# Patient Record
Sex: Female | Born: 2000 | Race: Black or African American | Hispanic: No | Smoking: Never smoker
Health system: Southern US, Community
[De-identification: ages and names within clinical notes are randomized; demographics above are authoritative.]

## PROBLEM LIST (undated history)

## (undated) DIAGNOSIS — J45909 Unspecified asthma, uncomplicated: Secondary | ICD-10-CM

## (undated) DIAGNOSIS — D571 Sickle-cell disease without crisis: Secondary | ICD-10-CM

## (undated) HISTORY — DX: Sickle-cell disease without crisis: D57.1

## (undated) HISTORY — PX: DIGIT NAIL REMOVAL: SHX5052

---
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
## 2023-04-04 IMAGING — US US PELVIS COMPLETE WITH TRANSVAGINAL
1 series · 13 of 25 positions shown · non-contrast
Comparison: None

CLINICAL DATA: Abnormal uterine bleeding

EXAM:
TRANSABDOMINAL AND TRANSVAGINAL ULTRASOUND OF PELVIS
TECHNIQUE: Both transabdominal and transvaginal ultrasound examinations of the
pelvis were performed. Transabdominal technique was performed for
global imaging of the pelvis including uterus, ovaries, adnexal
regions, and pelvic cul-de-sac. It was necessary to proceed with
endovaginal exam following the transabdominal exam to visualize the
uterus endometrium adnexa.

[Series 1: us pelvis complete with transvaginal · 0.17mm/px · 13 of 112 slices shown]
[im 1/112]
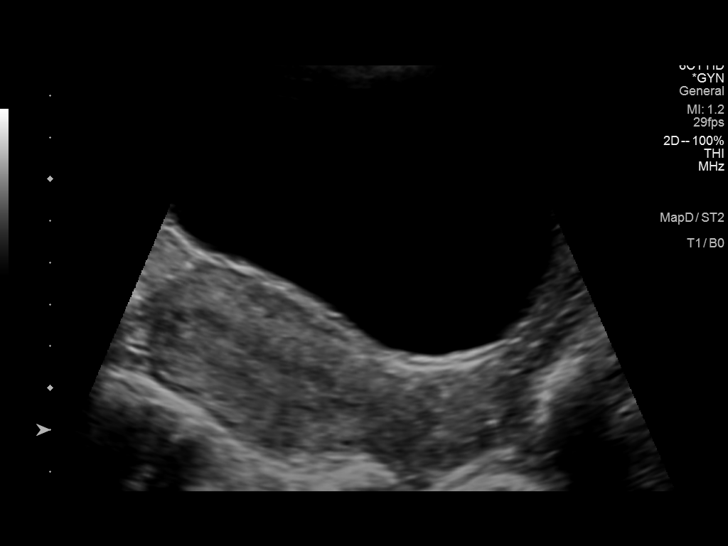
[im 10/112]
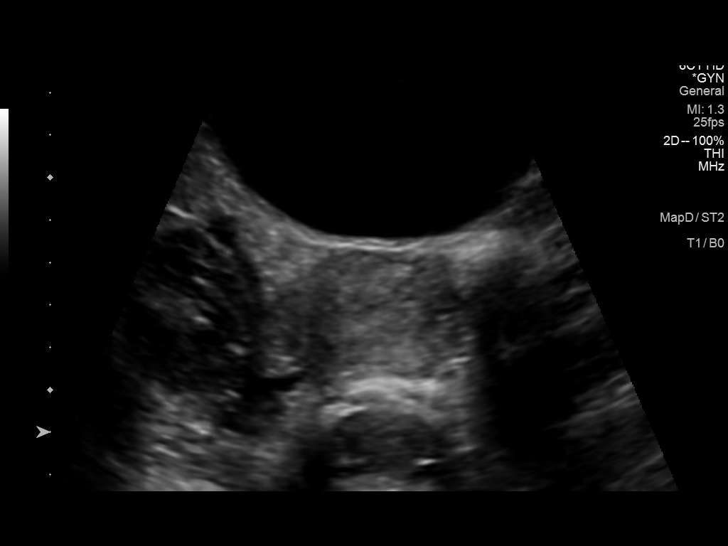
[im 19/112]
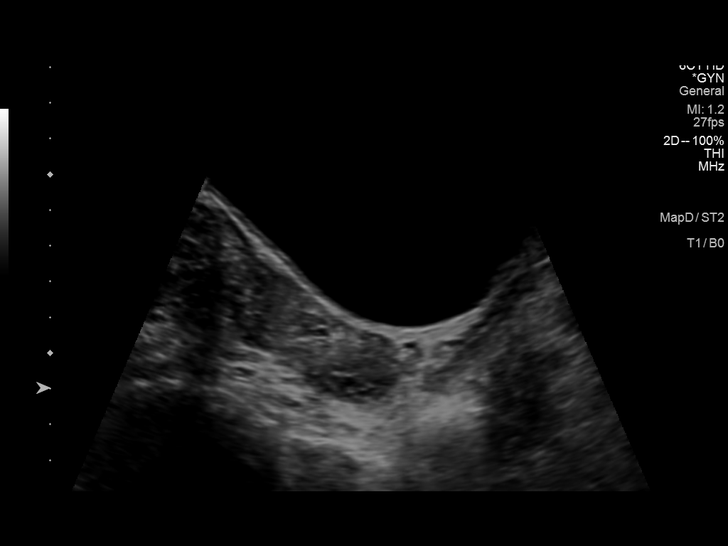
[im 28/112]
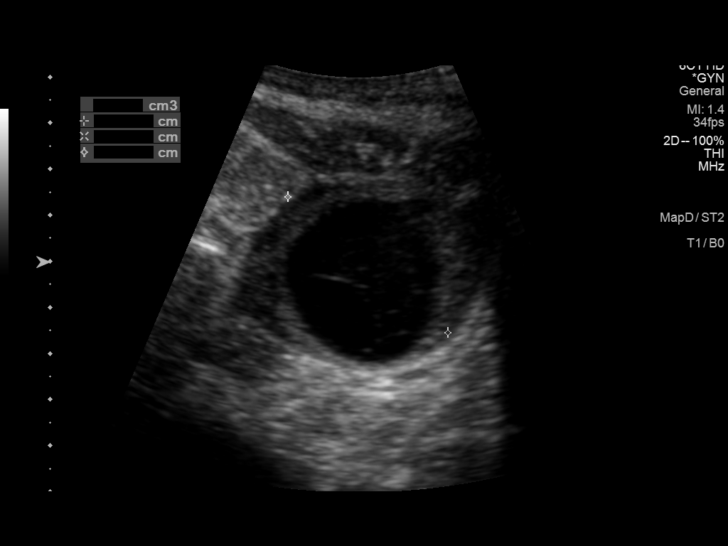
[im 38/112]
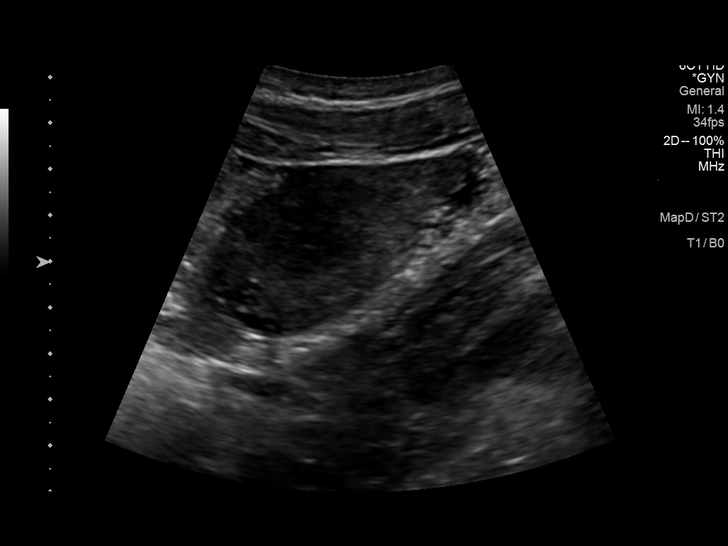
[im 47/112]
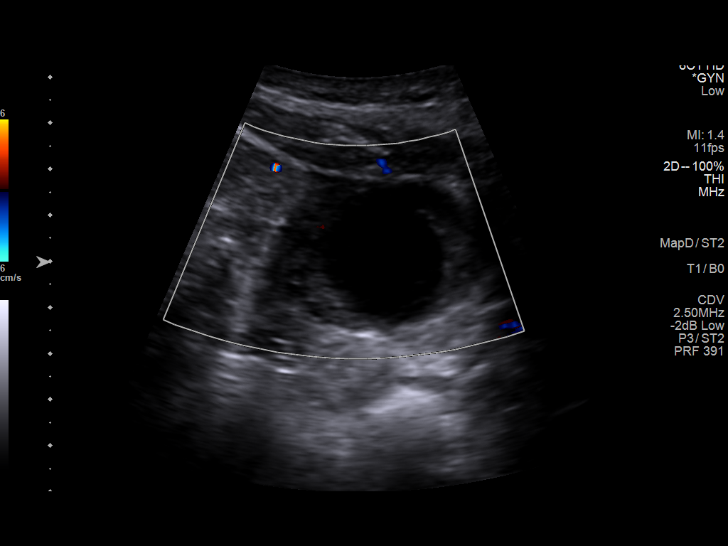
[im 56/112]
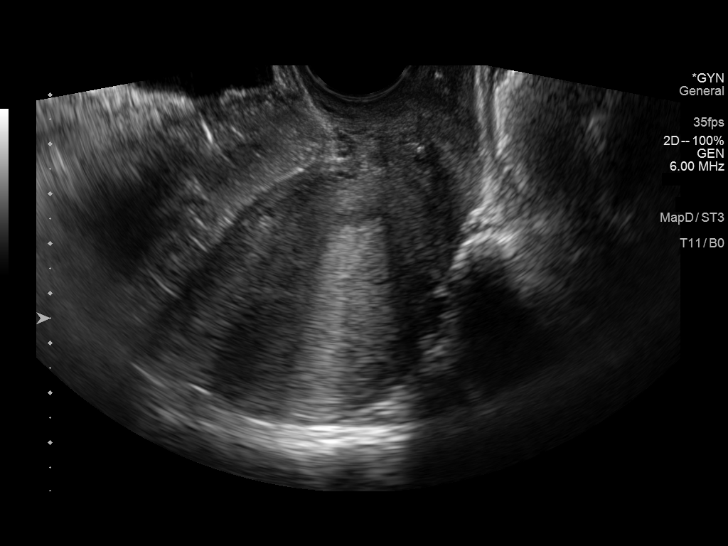
[im 65/112]
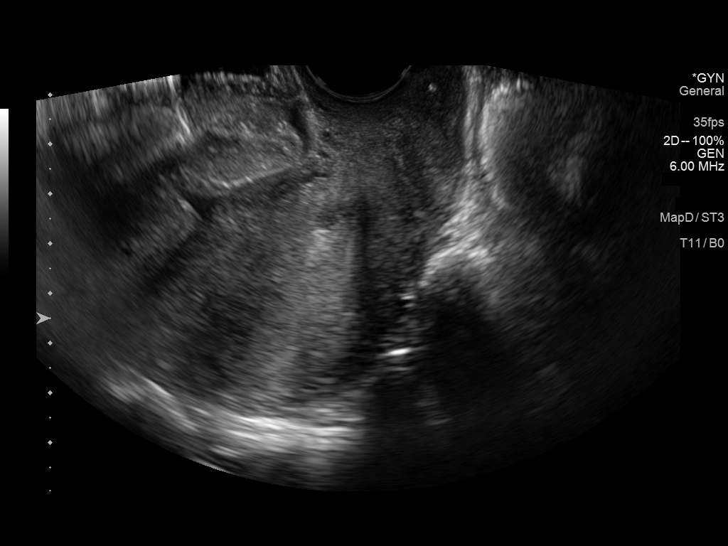
[im 75/112]
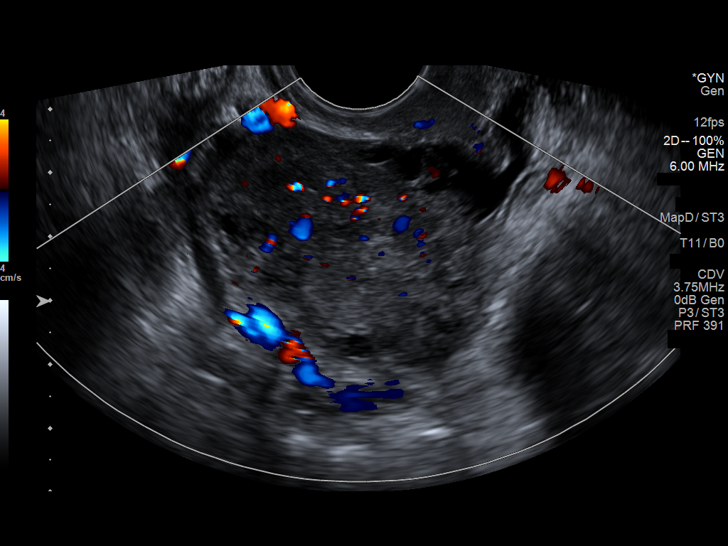
[im 84/112]
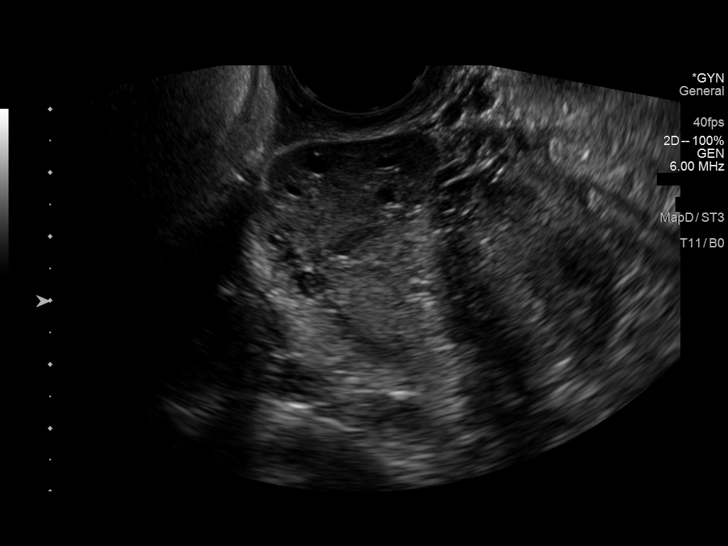
[im 93/112]
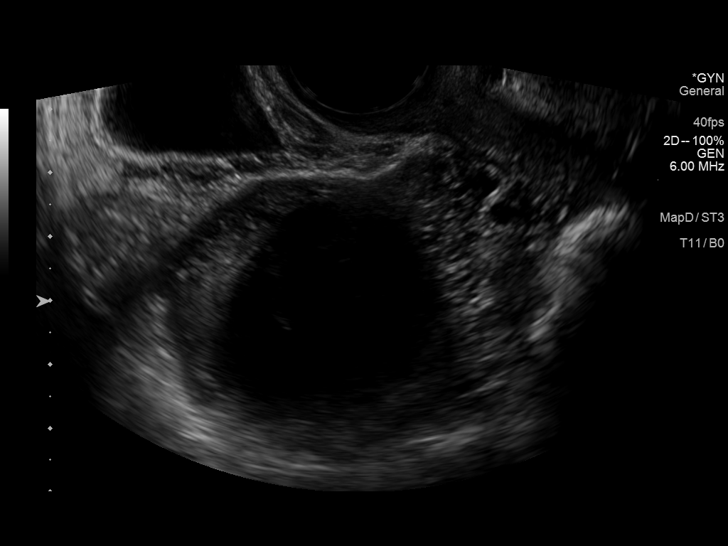
[im 102/112]
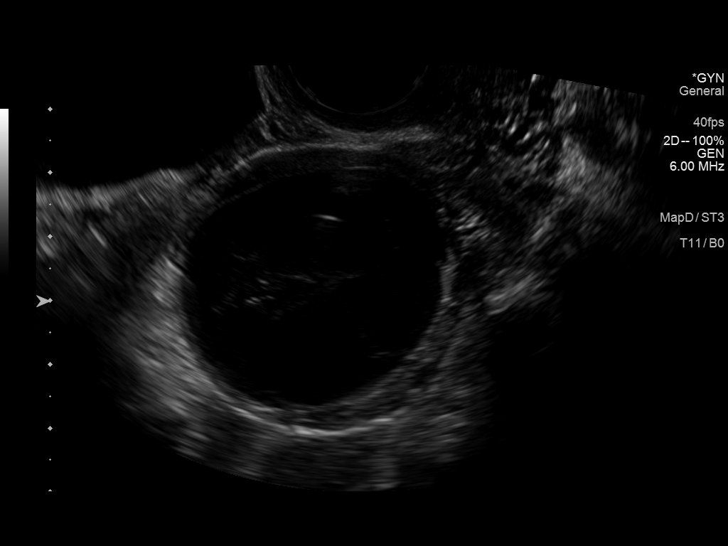
[im 112/112]
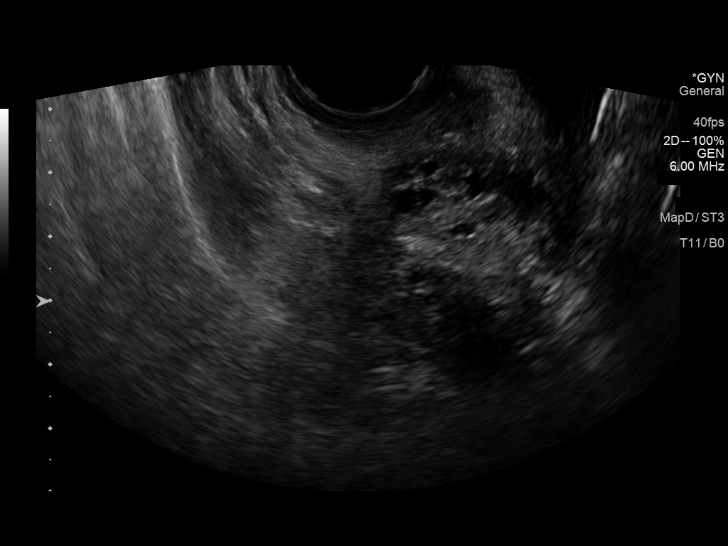

[13 of 25 positions shown; findings below may reference images not displayed]

FINDINGS: Uterus

Measurements: 9.1 x 3.6 x 5.4 cm = volume: 91 mL. No fibroids or
other mass visualized.

Endometrium

Thickness: 15.8 mm.  No focal abnormality visualized.

Right ovary

Measurements: 4.9 x 3.2 x 2.5 cm = volume: 20.4 mL. Normal
appearance/no adnexal mass.

Left ovary

Measurements: 6.1 x 4.3 x 4.8 cm = volume: 67.7 mL. Complex cyst
measuring 3.9 x 3.9 x 3.6 cm, contains internal reticular echoes and
is avascular and felt consistent with a hemorrhagic cyst. No
follow-up imaging recommended.

Other findings

Trace free fluid
IMPRESSION: 1. Endometrial thickness of 15.8 mm. If bleeding remains
unresponsive to hormonal or medical therapy, sonohysterogram should
be considered for focal lesion work-up. (Ref: Radiological
Reasoning: Algorithmic Workup of Abnormal Vaginal Bleeding with
Endovaginal Sonography and Sonohysterography. AJR 4552; [id number])
2. Trace free fluid in the pelvis
